# Patient Record
Sex: Female | Born: 1970 | Race: White | Hispanic: No
Health system: Southern US, Community
[De-identification: ages and names within clinical notes are randomized; demographics above are authoritative.]

---

## 1989-05-24 HISTORY — PX: AUGMENTATION MAMMAPLASTY: SUR837

## 2000-02-19 ENCOUNTER — Other Ambulatory Visit: Admission: RE | Admit: 2000-02-19 | Discharge: 2000-02-19 | Payer: Self-pay | Admitting: Obstetrics and Gynecology

## 2000-09-10 ENCOUNTER — Inpatient Hospital Stay (HOSPITAL_COMMUNITY): Admission: AD | Admit: 2000-09-10 | Discharge: 2000-09-12 | Payer: Self-pay | Admitting: Obstetrics and Gynecology

## 2000-10-11 ENCOUNTER — Other Ambulatory Visit: Admission: RE | Admit: 2000-10-11 | Discharge: 2000-10-11 | Payer: Self-pay | Admitting: Obstetrics and Gynecology

## 2001-12-13 ENCOUNTER — Other Ambulatory Visit: Admission: RE | Admit: 2001-12-13 | Discharge: 2001-12-13 | Payer: Self-pay | Admitting: Obstetrics and Gynecology

## 2003-06-17 ENCOUNTER — Other Ambulatory Visit: Admission: RE | Admit: 2003-06-17 | Discharge: 2003-06-17 | Payer: Self-pay | Admitting: Obstetrics and Gynecology

## 2005-03-04 ENCOUNTER — Other Ambulatory Visit: Admission: RE | Admit: 2005-03-04 | Discharge: 2005-03-04 | Payer: Self-pay | Admitting: Obstetrics and Gynecology

## 2010-06-17 ENCOUNTER — Ambulatory Visit: Admission: RE | Admit: 2010-06-17 | Payer: Self-pay | Source: Home / Self Care | Admitting: Emergency Medicine

## 2010-06-17 ENCOUNTER — Ambulatory Visit
Admission: RE | Admit: 2010-06-17 | Discharge: 2010-06-17 | Payer: Self-pay | Source: Home / Self Care | Admitting: Emergency Medicine

## 2010-06-18 ENCOUNTER — Encounter: Payer: Self-pay | Admitting: Family Medicine

## 2010-06-20 ENCOUNTER — Telehealth (INDEPENDENT_AMBULATORY_CARE_PROVIDER_SITE_OTHER): Payer: Self-pay | Admitting: *Deleted

## 2010-06-25 NOTE — Miscellaneous (Signed)
Summary: TDaP VACCINE AUTH. FORM  TDaP VACCINE AUTH. FORM   Imported By: Tawni Carnes 06/18/2010 10:24:28  _____________________________________________________________________  External Attachment:    Type:   Image     Comment:   External Document

## 2010-06-25 NOTE — Progress Notes (Signed)
  Phone Note Outgoing Call Call back at [facility] Phone [phone]   Call placed by: Emilio Math,  June 20, 2010 2:44 PM Call placed to: Patient Summary of Call: Swelling has gone down hand is doing a lot better

## 2010-06-25 NOTE — Assessment & Plan Note (Signed)
Summary: CAT BITE/WSE   Vital Signs:  Patient Profile:   40 Years Old Female CC:      cate bite to right and x yesterday AM Height:     67.5 inches Weight:      142 pounds O2 Sat:      100 % O2 treatment:    Room Air Temp:     98.9 degrees F oral Pulse rate:   61 / minute Resp:     14 per minute BP sitting:   138 / 83  (left arm) Cuff size:   regular  Pt. in pain?   yes    Location:   right hand    Type:       burning/sore  Vitals Entered By: Lajean Saver RN (June 17, 2010 1:51 PM)                   Updated Prior Medication List: DEPO-PROVERA 150 MG/ML SUSP (MEDROXYPROGESTERONE ACETATE) 4xs/year  Current Allergies: No known allergies History of Present Illness Chief Complaint: cate bite to right and x yesterday AM History of Present Illness: Cat bite to L hand yesterday.  It was her cat, up to date on shots.  She, however, hasn't had a tetanus shot in 10 years.  It is red and swollen and somewhat painful.  No drainage or pus.  No F/C/N/V.  Not taking any OTC meds.  REVIEW OF SYSTEMS Constitutional Symptoms      Denies fever, chills, night sweats, weight loss, weight gain, and fatigue.  Eyes       Denies change in vision, eye pain, eye discharge, glasses, contact lenses, and eye surgery. Ear/Nose/Throat/Mouth       Denies hearing loss/aids, change in hearing, ear pain, ear discharge, dizziness, frequent runny nose, frequent nose bleeds, sinus problems, sore throat, hoarseness, and tooth pain or bleeding.  Respiratory       Denies dry cough, productive cough, wheezing, shortness of breath, asthma, bronchitis, and emphysema/COPD.  Cardiovascular       Denies murmurs, chest pain, and tires easily with exhertion.    Gastrointestinal       Denies stomach pain, nausea/vomiting, diarrhea, constipation, blood in bowel movements, and indigestion. Genitourniary       Denies painful urination, blood or discharge from vagina, kidney stones, and loss of urinary  control. Neurological       Denies paralysis, seizures, and fainting/blackouts. Musculoskeletal       Complains of redness and swelling.      Denies muscle pain, joint pain, joint stiffness, decreased range of motion, muscle weakness, and gout.      Comments: right hand Skin       Denies bruising, unusual mles/lumps or sores, and hair/skin or nail changes.  Psych       Denies mood changes, temper/anger issues, anxiety/stress, speech problems, depression, and sleep problems. Other Comments: patient was bitten by her cat yesterday AM. Her cat is up to date on all of its shots. She has redness, swelling, and some pain with movement.   Past History:  Past Medical History: Unremarkable  Past Surgical History: Denies surgical history  Family History: none  Social History: Current Smoker 1/2 PPD off and on x 10 years Alcohol use-yes Drug use-no Occup: [employer] Status:  current Drug Use:  no Physical Exam General appearance: well developed, well nourished, no acute distress Skin: see below MSE: oriented to time, place, and person Left hand with 2 healing puncture wounds on dorsum of  hand with surrounding erythema and swelling and mild tenderness.  No purulence or drainage.  No proximal LAD or streaking.  Otherwise FROM of wrist, hands, and all fingers. Distal NV status intact. Assessment New Problems: CAT BITE (ICD-E906.3)   Plan New Medications/Changes: DOXYCYCLINE HYCLATE 100 MG CAPS (DOXYCYCLINE HYCLATE) 1 by mouth two times a day for 10 days  #20 x 0, 06/17/2010, Hoyt Koch MD  New Orders: New Patient Level III [99203] Tdap => 32yrs IM [90715] Admin 1st Vaccine [90471] Planning Comments:   Keep clean and dry.  Take antibiotics as directed.  Will call patient in 3 days to see how she is doing and ensure that the redness and swelling is improving.  Seek medical care if fever, more pain, pus, etc.  Use ice and Motrin for swelling and pain.  Tetanus shot  given today.   The patient and/or caregiver has been counseled thoroughly with regard to medications prescribed including dosage, schedule, interactions, rationale for use, and possible side effects and they verbalize understanding.  Diagnoses and expected course of recovery discussed and will return if not improved as expected or if the condition worsens. Patient and/or caregiver verbalized understanding.  Prescriptions: DOXYCYCLINE HYCLATE 100 MG CAPS (DOXYCYCLINE HYCLATE) 1 by mouth two times a day for 10 days  #20 x 0   Entered and Authorized by:   Hoyt Koch MD   Signed by:   Hoyt Koch MD on 06/17/2010   Method used:   Print then Give to Patient   RxID:   [id number]   Orders Added: 1)  New Patient Level III [14782] 2)  Tdap => 79yrs IM [95621] 3)  Admin 1st Vaccine [30865]   Immunizations Administered:  Tetanus Vaccine:    Vaccine Type: Tdap    Site: left deltoid    Mfr: GlaxoSmithKline    Dose: 0.5 ml    Route: IM    Given by: Lajean Saver RN    Exp. Date: 03/15/2012    Lot #: [id number]    VIS given: 04/10/08 version given June 17, 2010.   Immunizations Administered:  Tetanus Vaccine:    Vaccine Type: Tdap    Site: left deltoid    Mfr: GlaxoSmithKline    Dose: 0.5 ml    Route: IM    Given by: Lajean Saver RN    Exp. Date: 03/15/2012    Lot #: [id number]    VIS given: 04/10/08 version given June 17, 2010.

## 2011-04-13 ENCOUNTER — Other Ambulatory Visit: Payer: Self-pay | Admitting: Obstetrics and Gynecology

## 2011-04-13 DIAGNOSIS — R928 Other abnormal and inconclusive findings on diagnostic imaging of breast: Secondary | ICD-10-CM

## 2011-04-27 ENCOUNTER — Ambulatory Visit
Admission: RE | Admit: 2011-04-27 | Discharge: 2011-04-27 | Disposition: A | Discharge status: Home / Self Care | Payer: PRIVATE HEALTH INSURANCE | Source: Ambulatory Visit | Attending: Obstetrics and Gynecology | Admitting: Obstetrics and Gynecology

## 2011-04-27 DIAGNOSIS — R928 Other abnormal and inconclusive findings on diagnostic imaging of breast: Secondary | ICD-10-CM

## 2011-11-16 ENCOUNTER — Other Ambulatory Visit: Payer: Self-pay | Admitting: Obstetrics and Gynecology

## 2011-11-16 DIAGNOSIS — N6009 Solitary cyst of unspecified breast: Secondary | ICD-10-CM

## 2011-12-02 ENCOUNTER — Ambulatory Visit
Admission: RE | Admit: 2011-12-02 | Discharge: 2011-12-02 | Disposition: A | Discharge status: Home / Self Care | Payer: PRIVATE HEALTH INSURANCE | Source: Ambulatory Visit | Attending: Obstetrics and Gynecology | Admitting: Obstetrics and Gynecology

## 2011-12-02 DIAGNOSIS — N6009 Solitary cyst of unspecified breast: Secondary | ICD-10-CM

## 2012-05-01 ENCOUNTER — Other Ambulatory Visit: Payer: Self-pay | Admitting: Obstetrics and Gynecology

## 2012-05-01 DIAGNOSIS — R921 Mammographic calcification found on diagnostic imaging of breast: Secondary | ICD-10-CM

## 2012-05-12 ENCOUNTER — Ambulatory Visit
Admission: RE | Admit: 2012-05-12 | Discharge: 2012-05-12 | Disposition: A | Discharge status: Home / Self Care | Payer: PRIVATE HEALTH INSURANCE | Source: Ambulatory Visit | Attending: Obstetrics and Gynecology | Admitting: Obstetrics and Gynecology

## 2012-05-12 DIAGNOSIS — R921 Mammographic calcification found on diagnostic imaging of breast: Secondary | ICD-10-CM

## 2013-04-10 ENCOUNTER — Other Ambulatory Visit: Payer: Self-pay | Admitting: Obstetrics and Gynecology

## 2013-04-10 DIAGNOSIS — R921 Mammographic calcification found on diagnostic imaging of breast: Secondary | ICD-10-CM

## 2013-05-14 ENCOUNTER — Ambulatory Visit
Admission: RE | Admit: 2013-05-14 | Discharge: 2013-05-14 | Disposition: A | Discharge status: Home / Self Care | Payer: PRIVATE HEALTH INSURANCE | Source: Ambulatory Visit | Attending: Obstetrics and Gynecology | Admitting: Obstetrics and Gynecology

## 2013-05-14 DIAGNOSIS — R921 Mammographic calcification found on diagnostic imaging of breast: Secondary | ICD-10-CM

## 2014-02-19 ENCOUNTER — Other Ambulatory Visit: Payer: Self-pay | Admitting: Obstetrics and Gynecology

## 2014-02-19 DIAGNOSIS — N644 Mastodynia: Secondary | ICD-10-CM

## 2014-02-22 ENCOUNTER — Ambulatory Visit
Admission: RE | Admit: 2014-02-22 | Discharge: 2014-02-22 | Disposition: A | Discharge status: Home / Self Care | Payer: PRIVATE HEALTH INSURANCE | Source: Ambulatory Visit | Attending: Obstetrics and Gynecology | Admitting: Obstetrics and Gynecology

## 2014-02-22 DIAGNOSIS — N644 Mastodynia: Secondary | ICD-10-CM

## 2014-04-10 ENCOUNTER — Other Ambulatory Visit: Payer: Self-pay

## 2014-04-10 DIAGNOSIS — Z1231 Encounter for screening mammogram for malignant neoplasm of breast: Secondary | ICD-10-CM

## 2014-05-15 ENCOUNTER — Ambulatory Visit
Admission: RE | Admit: 2014-05-15 | Discharge: 2014-05-15 | Disposition: A | Discharge status: Home / Self Care | Payer: No Typology Code available for payment source | Source: Ambulatory Visit

## 2014-05-15 ENCOUNTER — Other Ambulatory Visit: Payer: Self-pay

## 2014-05-15 DIAGNOSIS — Z1231 Encounter for screening mammogram for malignant neoplasm of breast: Secondary | ICD-10-CM

## 2014-05-16 ENCOUNTER — Other Ambulatory Visit: Payer: Self-pay | Admitting: Obstetrics and Gynecology

## 2014-05-16 DIAGNOSIS — R928 Other abnormal and inconclusive findings on diagnostic imaging of breast: Secondary | ICD-10-CM

## 2014-05-31 ENCOUNTER — Ambulatory Visit
Admission: RE | Admit: 2014-05-31 | Discharge: 2014-05-31 | Disposition: A | Discharge status: Home / Self Care | Payer: No Typology Code available for payment source | Source: Ambulatory Visit | Attending: Obstetrics and Gynecology | Admitting: Obstetrics and Gynecology

## 2014-05-31 DIAGNOSIS — R928 Other abnormal and inconclusive findings on diagnostic imaging of breast: Secondary | ICD-10-CM

## 2015-04-30 ENCOUNTER — Other Ambulatory Visit: Payer: Self-pay

## 2015-04-30 DIAGNOSIS — Z1231 Encounter for screening mammogram for malignant neoplasm of breast: Secondary | ICD-10-CM

## 2015-06-03 ENCOUNTER — Other Ambulatory Visit: Payer: Self-pay

## 2015-06-03 ENCOUNTER — Ambulatory Visit
Admission: RE | Admit: 2015-06-03 | Discharge: 2015-06-03 | Disposition: A | Discharge status: Home / Self Care | Payer: BLUE CROSS/BLUE SHIELD | Source: Ambulatory Visit

## 2015-06-03 DIAGNOSIS — Z1231 Encounter for screening mammogram for malignant neoplasm of breast: Secondary | ICD-10-CM

## 2016-03-23 ENCOUNTER — Other Ambulatory Visit: Payer: Self-pay | Admitting: Obstetrics and Gynecology

## 2016-03-23 DIAGNOSIS — N644 Mastodynia: Secondary | ICD-10-CM

## 2016-03-29 ENCOUNTER — Other Ambulatory Visit: Payer: BLUE CROSS/BLUE SHIELD

## 2016-07-07 ENCOUNTER — Other Ambulatory Visit: Payer: Self-pay | Admitting: Obstetrics and Gynecology

## 2016-07-07 DIAGNOSIS — Z1231 Encounter for screening mammogram for malignant neoplasm of breast: Secondary | ICD-10-CM

## 2016-08-05 ENCOUNTER — Ambulatory Visit
Admission: RE | Admit: 2016-08-05 | Discharge: 2016-08-05 | Disposition: A | Discharge status: Home / Self Care | Payer: BLUE CROSS/BLUE SHIELD | Source: Ambulatory Visit | Attending: Obstetrics and Gynecology | Admitting: Obstetrics and Gynecology

## 2016-08-05 DIAGNOSIS — Z1231 Encounter for screening mammogram for malignant neoplasm of breast: Secondary | ICD-10-CM

## 2017-07-05 ENCOUNTER — Other Ambulatory Visit: Payer: Self-pay | Admitting: Obstetrics and Gynecology

## 2017-07-05 DIAGNOSIS — Z1231 Encounter for screening mammogram for malignant neoplasm of breast: Secondary | ICD-10-CM

## 2017-08-11 ENCOUNTER — Ambulatory Visit
Admission: RE | Admit: 2017-08-11 | Discharge: 2017-08-11 | Disposition: A | Discharge status: Home / Self Care | Payer: BLUE CROSS/BLUE SHIELD | Source: Ambulatory Visit | Attending: Obstetrics and Gynecology | Admitting: Obstetrics and Gynecology

## 2017-08-11 DIAGNOSIS — Z1231 Encounter for screening mammogram for malignant neoplasm of breast: Secondary | ICD-10-CM

## 2018-07-14 ENCOUNTER — Other Ambulatory Visit: Payer: Self-pay | Admitting: Obstetrics and Gynecology

## 2018-07-14 DIAGNOSIS — Z1231 Encounter for screening mammogram for malignant neoplasm of breast: Secondary | ICD-10-CM

## 2018-08-16 ENCOUNTER — Ambulatory Visit: Payer: BLUE CROSS/BLUE SHIELD

## 2018-09-13 ENCOUNTER — Ambulatory Visit: Payer: BLUE CROSS/BLUE SHIELD

## 2018-11-02 ENCOUNTER — Ambulatory Visit
Admission: RE | Admit: 2018-11-02 | Discharge: 2018-11-02 | Disposition: A | Discharge status: Home / Self Care | Payer: BC Managed Care – PPO | Source: Ambulatory Visit | Attending: Obstetrics and Gynecology | Admitting: Obstetrics and Gynecology

## 2018-11-02 ENCOUNTER — Other Ambulatory Visit: Payer: Self-pay

## 2018-11-02 DIAGNOSIS — Z1231 Encounter for screening mammogram for malignant neoplasm of breast: Secondary | ICD-10-CM

## 2019-11-13 ENCOUNTER — Other Ambulatory Visit: Payer: Self-pay | Admitting: Obstetrics and Gynecology

## 2019-11-13 DIAGNOSIS — Z1231 Encounter for screening mammogram for malignant neoplasm of breast: Secondary | ICD-10-CM

## 2019-12-11 ENCOUNTER — Ambulatory Visit
Admission: RE | Admit: 2019-12-11 | Discharge: 2019-12-11 | Disposition: A | Discharge status: Home / Self Care | Payer: BC Managed Care – PPO | Source: Ambulatory Visit | Attending: Obstetrics and Gynecology | Admitting: Obstetrics and Gynecology

## 2019-12-11 ENCOUNTER — Other Ambulatory Visit: Payer: Self-pay

## 2019-12-11 DIAGNOSIS — Z1231 Encounter for screening mammogram for malignant neoplasm of breast: Secondary | ICD-10-CM

## 2020-11-06 IMAGING — MG DIGITAL SCREENING BREAST BILAT IMPLANT W/ TOMO W/ CAD
8 of 16 series · 8 of 40 positions shown · non-contrast
Comparison: Previous exam(s).

CLINICAL DATA: Screening.

EXAM:
DIGITAL SCREENING BILATERAL MAMMOGRAM WITH IMPLANTS, CAD AND TOMO
The patient has retropectoral implants. Standard and implant
displaced views were performed.

[L CC]
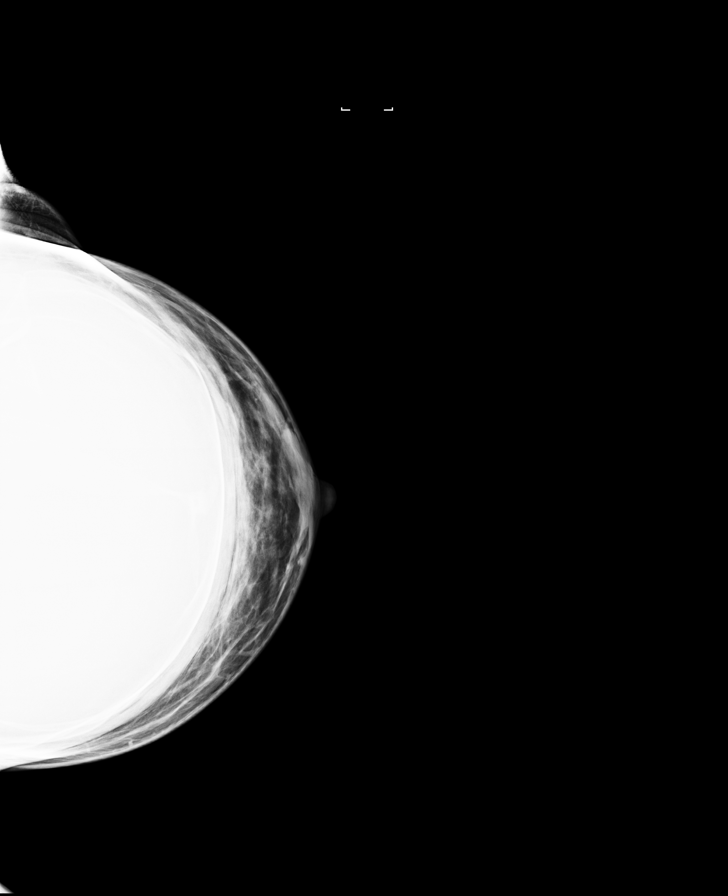

[R CC]
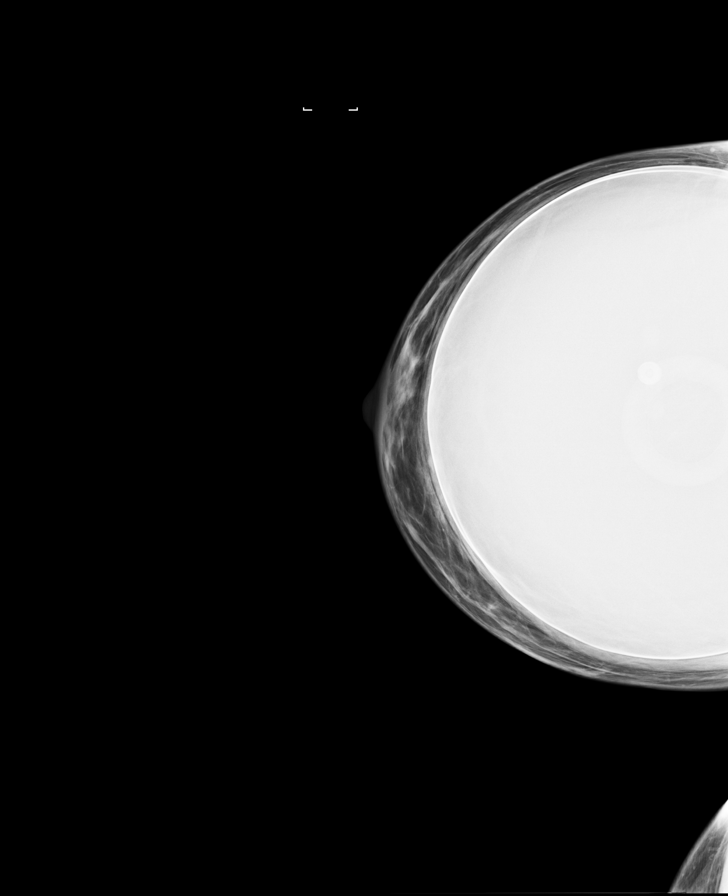

[R MLO]
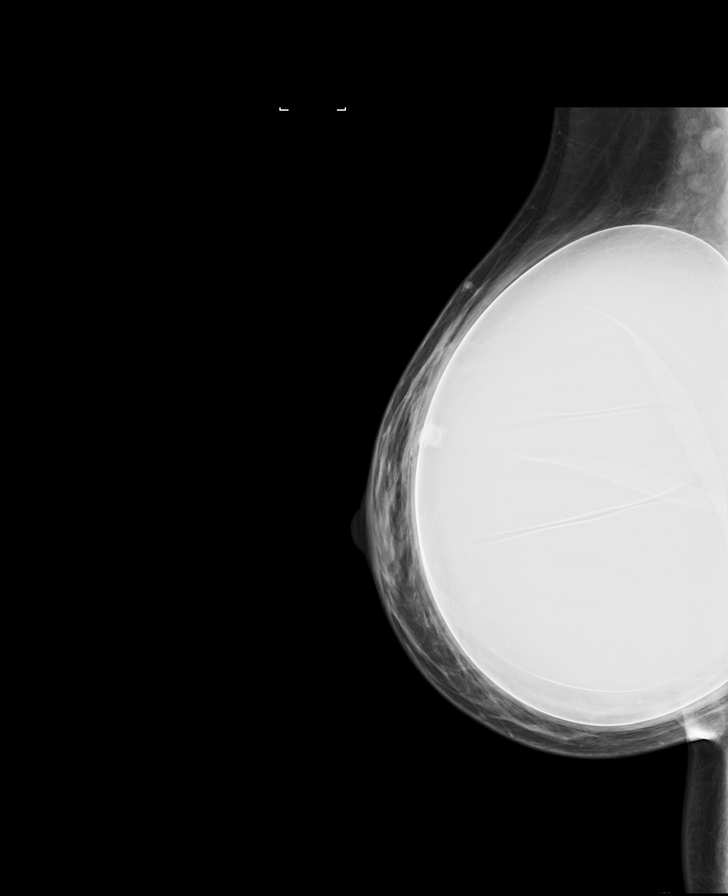

[L MLO]
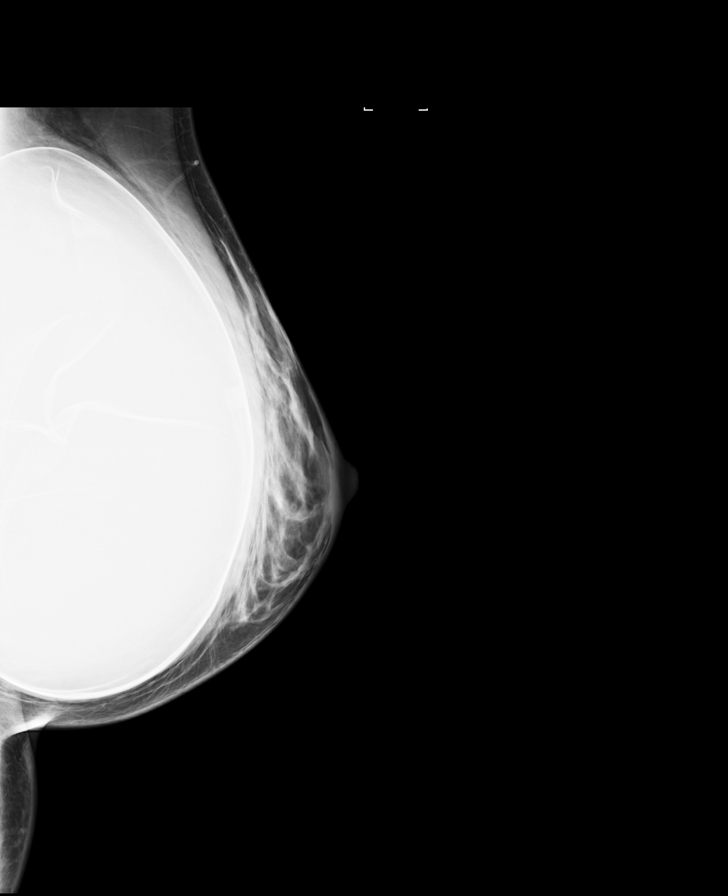

[L MLO synth-2D]
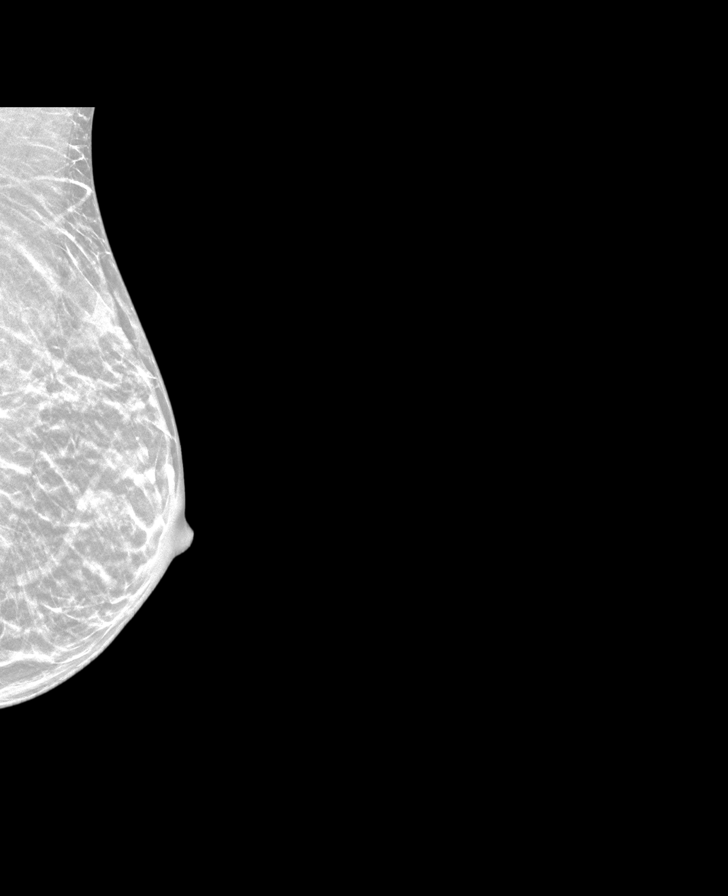

[R CC synth-2D (1 of 2)]
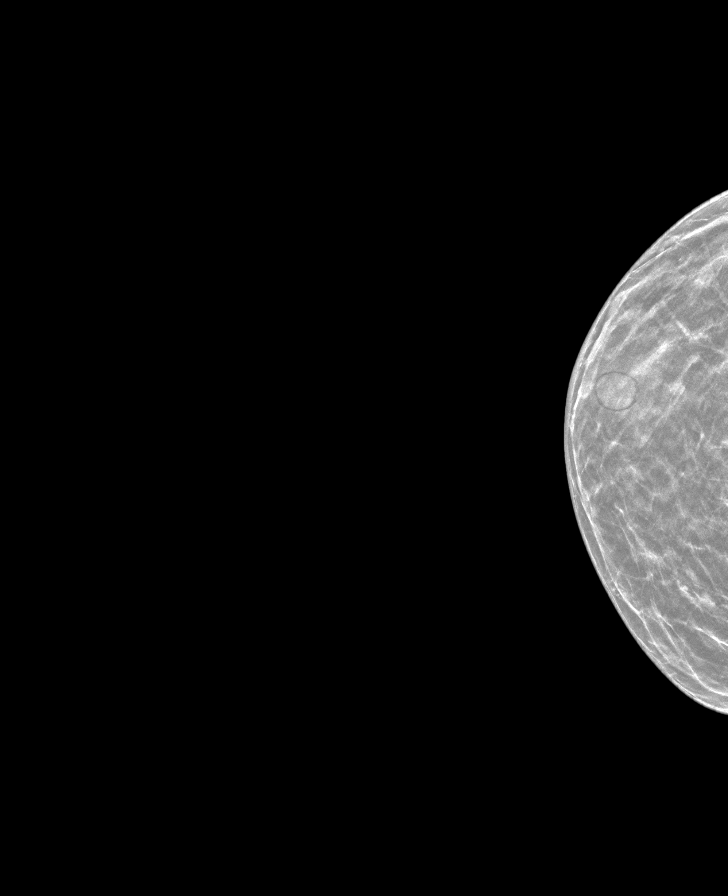

[L CC synth-2D]
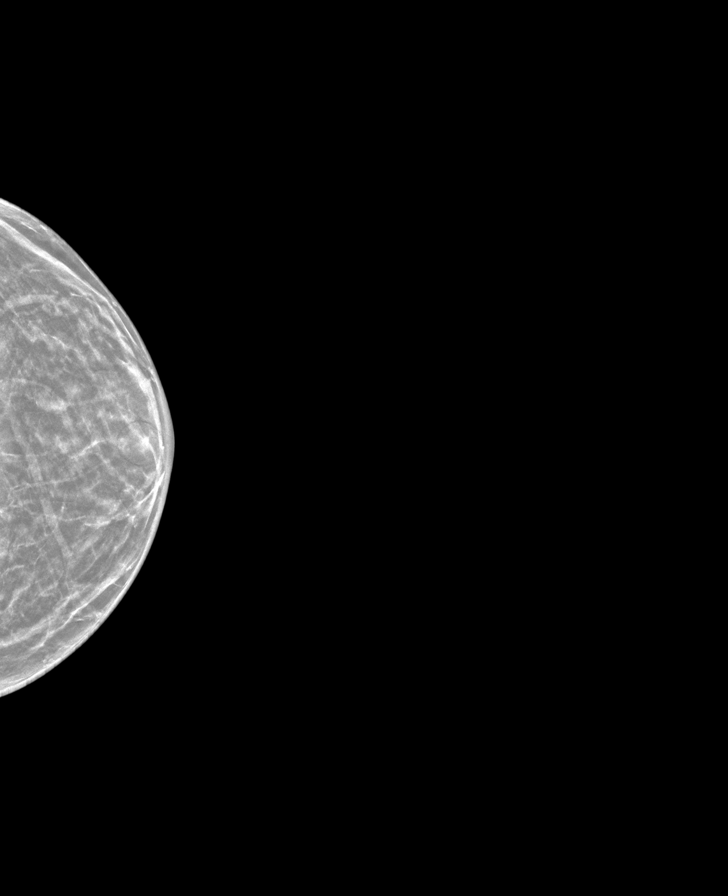

[R CC synth-2D (2 of 2)]
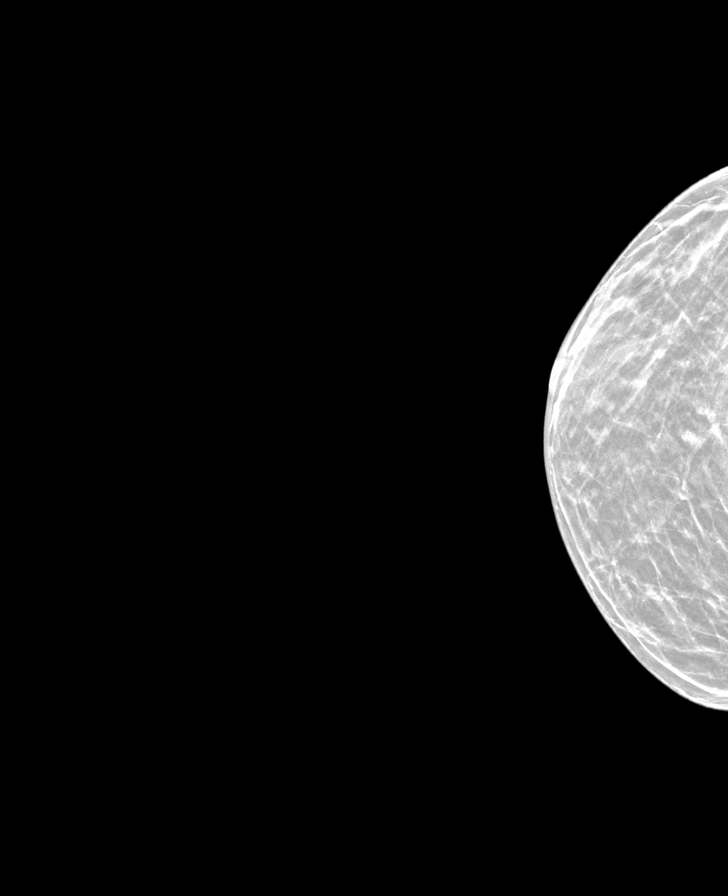

[8 of 40 positions shown; findings below may reference images not displayed]

ACR Breast Density Category b: There are scattered areas of
fibroglandular density.
FINDINGS: There are no findings suspicious for malignancy. Images were
processed with CAD.
IMPRESSION: No mammographic evidence of malignancy. A result letter of this
screening mammogram will be mailed directly to the patient.

RECOMMENDATION:
Screening mammogram in one year. (Code:[id number]-T-[id number])

BI-RADS CATEGORY  1:  Negative.
